# Patient Record
Sex: Female | Born: 2000 | Race: Black or African American | Hispanic: No | Marital: Single | Smoking: Former smoker
Health system: Southern US, Community
[De-identification: ages and names within clinical notes are randomized; demographics above are authoritative.]

## PROBLEM LIST (undated history)

## (undated) ENCOUNTER — Inpatient Hospital Stay (HOSPITAL_COMMUNITY): Payer: Self-pay

## (undated) DIAGNOSIS — D66 Hereditary factor VIII deficiency: Secondary | ICD-10-CM

## (undated) DIAGNOSIS — D691 Qualitative platelet defects: Secondary | ICD-10-CM

## (undated) HISTORY — PX: TONSILLECTOMY AND ADENOIDECTOMY: SUR1326

---
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
## 2019-01-02 ENCOUNTER — Ambulatory Visit: Payer: Medicaid Other | Admitting: Obstetrics and Gynecology

## 2019-01-10 ENCOUNTER — Ambulatory Visit: Payer: Medicaid Other | Admitting: Obstetrics & Gynecology

## 2019-01-15 ENCOUNTER — Ambulatory Visit: Payer: Medicaid Other

## 2019-01-16 ENCOUNTER — Other Ambulatory Visit: Payer: Self-pay

## 2019-01-16 ENCOUNTER — Encounter: Payer: Self-pay | Admitting: Obstetrics and Gynecology

## 2019-01-16 ENCOUNTER — Ambulatory Visit (INDEPENDENT_AMBULATORY_CARE_PROVIDER_SITE_OTHER): Payer: Medicaid Other | Admitting: Obstetrics and Gynecology

## 2019-01-16 DIAGNOSIS — Z1389 Encounter for screening for other disorder: Secondary | ICD-10-CM

## 2019-01-16 NOTE — Progress Notes (Signed)
   Post Partum Exam  Tammy Lynch is a 18 y.o. G37P1001 female who presents for a postpartum visit. She is 8 weeks postpartum following a spontaneous vaginal delivery. I have fully reviewed the prenatal and intrapartum course. The delivery was at 40 gestational weeks.  Anesthesia: epidural. Postpartum course has been uncomplicated. Baby's course has been uncomplicated. Baby is feeding by both breast and bottle - Good Start Gentle. Bleeding moderate lochia. Bowel function is normal. Bladder function is normal. Patient is not sexually active. Contraception method is Depo-Provera injections. Postpartum depression screening: score 5  The following portions of the patient's history were reviewed and updated as appropriate: allergies, current medications, past family history, past medical history, past social history, past surgical history and problem list. Pap smear N/A due to age and was n/a  Review of Systems Constitutional: negative Eyes: negative Ears, nose, mouth, throat, and face: negative Respiratory: negative Cardiovascular: negative Gastrointestinal: negative Genitourinary:positive for vaginal pain from repair Integument/breast: negative Hematologic/lymphatic: negative Musculoskeletal:negative Neurological: negative Behavioral/Psych: negative Endocrine: negative Allergic/Immunologic: negative    Objective:  Blood pressure 123/73, pulse 94, temperature 98.4 F (36.9 C), temperature source Oral, height 5\' 7"  (1.702 m), weight 234 lb 3.2 oz (106.2 kg), currently breastfeeding.  General:  alert, cooperative and no distress   Breasts:  inspection negative, no nipple discharge or bleeding, no masses or nodularity palpable  Lungs: clear to auscultation bilaterally  Heart:  regular rate and rhythm, S1, S2 normal, no murmur, click, rub or gallop  Abdomen: soft, non-tender; bowel sounds normal; no masses,  no organomegaly   Vulva:  normal  Vagina: normal vagina and 2nd degree repair  healing well, intact, good approximation  Cervix:  not examined  Corpus: not examined  Adnexa:  not evaluated  Rectal Exam: Not performed.        Assessment:  Encounter for postpartum visit Normal postpartum exam. Pap smear not done at today's visit.   Pain related to vaginal delivery     Plan:   1. Contraception: Depo-Provera injections 2. Reassurance given that vaginal repair well-healed with good approximation and firm support 3. Advised to soak in tub of warm water with Epsom salt 2 times/day for 15-20 mins at a time. 3. Follow up in: 1 year  or as needed. Depo every 3 months.  Laury Deep, CNM

## 2019-02-17 ENCOUNTER — Ambulatory Visit: Payer: Medicaid Other

## 2019-02-20 ENCOUNTER — Other Ambulatory Visit: Payer: Self-pay

## 2019-02-20 ENCOUNTER — Ambulatory Visit (INDEPENDENT_AMBULATORY_CARE_PROVIDER_SITE_OTHER): Payer: Medicaid Other | Admitting: *Deleted

## 2019-02-20 VITALS — BP 114/74 | HR 91 | Temp 98.3°F | Ht 67.0 in | Wt 231.0 lb

## 2019-02-20 DIAGNOSIS — Z3042 Encounter for surveillance of injectable contraceptive: Secondary | ICD-10-CM

## 2019-02-20 MED ORDER — MEDROXYPROGESTERONE ACETATE 150 MG/ML IM SUSP
150.0000 mg | INTRAMUSCULAR | Status: AC
  Administered 2019-02-20 – 2020-04-15 (×6): 150 mg via INTRAMUSCULAR

## 2019-02-20 NOTE — Progress Notes (Signed)
   Subjective:  Pt in for Depo Provera injection.    Objective: Need for contraception. No unusual complaints.    Assessment: Pt tolerated Depo injection. Depo given Left Deltoid.   Plan:  Next injection due Feb. 25-May 22, 2019.    Derl Barrow, RN

## 2019-02-24 ENCOUNTER — Inpatient Hospital Stay: Payer: Medicaid Other

## 2019-02-24 ENCOUNTER — Inpatient Hospital Stay: Payer: Medicaid Other | Admitting: Hematology

## 2019-03-26 ENCOUNTER — Other Ambulatory Visit: Payer: Medicaid Other

## 2019-03-26 ENCOUNTER — Ambulatory Visit: Payer: Medicaid Other | Admitting: Hematology

## 2019-03-27 ENCOUNTER — Telehealth: Payer: Self-pay | Admitting: Hematology

## 2019-03-27 NOTE — Telephone Encounter (Signed)
Scheduled per 1/13 sch msg. Called and spoke with pt, confirmed 2/8 appt

## 2019-03-28 ENCOUNTER — Other Ambulatory Visit: Payer: Self-pay

## 2019-03-28 ENCOUNTER — Ambulatory Visit (INDEPENDENT_AMBULATORY_CARE_PROVIDER_SITE_OTHER): Payer: Medicaid Other | Admitting: Advanced Practice Midwife

## 2019-03-28 ENCOUNTER — Other Ambulatory Visit (HOSPITAL_COMMUNITY)
Admission: RE | Admit: 2019-03-28 | Discharge: 2019-03-28 | Disposition: A | Discharge status: Home / Self Care | Payer: Medicaid Other | Source: Ambulatory Visit | Attending: Advanced Practice Midwife | Admitting: Advanced Practice Midwife

## 2019-03-28 ENCOUNTER — Encounter: Payer: Self-pay | Admitting: Advanced Practice Midwife

## 2019-03-28 VITALS — BP 124/74 | HR 76 | Wt 226.0 lb

## 2019-03-28 DIAGNOSIS — R300 Dysuria: Secondary | ICD-10-CM

## 2019-03-28 DIAGNOSIS — N9089 Other specified noninflammatory disorders of vulva and perineum: Secondary | ICD-10-CM | POA: Diagnosis not present

## 2019-03-28 LAB — POCT URINE QUALITATIVE DIPSTICK BLOOD

## 2019-03-28 MED ORDER — VALACYCLOVIR HCL 1 G PO TABS: 1000.0000 mg | ORAL_TABLET | Freq: Two times a day (BID) | ORAL | 0 refills | Status: AC

## 2019-03-28 NOTE — Progress Notes (Signed)
  Subjective:     Patient ID: Tammy Lynch, female   DOB: 11/01/2000, 19 y.o.   MRN: [id number]  Tammy Lynch is a 19 y.o. G1P1001 who is here today with pain with urination. She reports that x 1 week she has had a stinging pain with urination.   Dysuria  This is a new problem. The current episode started in the past 7 days. The problem occurs every urination. The problem has been gradually improving. The quality of the pain is described as burning. The pain is at a severity of 6/10. There has been no fever. She is sexually active. Associated symptoms include frequency. Pertinent negatives include no chills, nausea or vomiting. She has tried nothing for the symptoms.     Review of Systems  Constitutional: Negative for chills and fever.  Gastrointestinal: Negative for nausea and vomiting.  Genitourinary: Positive for dysuria and frequency. Negative for pelvic pain, vaginal bleeding and vaginal discharge.       Objective:   Physical Exam Vitals and nursing note reviewed. Exam conducted with a chaperone present (herpatic appearing lesions ).  Constitutional:      Appearance: Normal appearance.  HENT:     Head: Normocephalic.  Cardiovascular:     Rate and Rhythm: Normal rate.  Pulmonary:     Effort: Pulmonary effort is normal.  Genitourinary:    Exam position: Lithotomy position.    Skin:    General: Skin is warm and dry.  Neurological:     General: No focal deficit present.     Mental Status: She is alert and oriented to person, place, and time.        Assessment:     1. Vulvar lesion   2. Dysuria        Plan:   HSV PCR GC/CT UA  RX: valtrex 1g BID x 10 days sent to pharmacy.    Thressa Sheller DNP, CNM  03/28/19  11:40 AM

## 2019-03-31 LAB — CERVICOVAGINAL ANCILLARY ONLY
Bacterial Vaginitis (gardnerella): POSITIVE — AB
Candida Glabrata: NEGATIVE
Candida Vaginitis: NEGATIVE
Chlamydia: NEGATIVE
Comment: NEGATIVE
Comment: NEGATIVE
Comment: NEGATIVE
Comment: NEGATIVE
Comment: NEGATIVE
Comment: NORMAL
Neisseria Gonorrhea: POSITIVE — AB
Trichomonas: NEGATIVE

## 2019-03-31 LAB — HERPES SIMPLEX VIRUS CULTURE

## 2019-04-01 ENCOUNTER — Encounter: Payer: Self-pay | Admitting: Advanced Practice Midwife

## 2019-04-03 ENCOUNTER — Ambulatory Visit (INDEPENDENT_AMBULATORY_CARE_PROVIDER_SITE_OTHER): Payer: Medicaid Other | Admitting: *Deleted

## 2019-04-03 ENCOUNTER — Other Ambulatory Visit: Payer: Self-pay

## 2019-04-03 VITALS — BP 133/84 | HR 106 | Temp 98.6°F | Ht 67.0 in | Wt 233.0 lb

## 2019-04-03 DIAGNOSIS — A549 Gonococcal infection, unspecified: Secondary | ICD-10-CM

## 2019-04-03 MED ORDER — CEFTRIAXONE SODIUM 250 MG IJ SOLR
250.0000 mg | Freq: Once | INTRAMUSCULAR | Status: AC
  Administered 2019-04-03: 250 mg via INTRAMUSCULAR

## 2019-04-03 MED ORDER — AZITHROMYCIN 500 MG PO TABS
1000.0000 mg | ORAL_TABLET | Freq: Once | ORAL | Status: AC
  Administered 2019-04-03: 1000 mg via ORAL

## 2019-04-03 NOTE — Progress Notes (Signed)
   S: Patient in nurse clinic today for STD treatment of Gonorrhea.    O: Need for treatment of Gonorrhea.  A: Azithromycin 1 GM PO x 1 given and Ceftriaxone 250 mg IM x 1 given in RUOQ per [facility]'s standing orders and Thressa Sheller, CNM.  Patient observed 15 minutes in office.  No reaction noted.   P: Patient to follow up in 1 month for re-screening.    STD report form fax completed and faxed to [facility] Department at [phone] (STD department).     Patient advised to abstain from sex for 7-10 days after treatment or when partner has been tested/treated.    Clovis Pu, RN

## 2019-04-04 ENCOUNTER — Encounter: Payer: Self-pay | Admitting: General Practice

## 2019-04-11 ENCOUNTER — Telehealth: Payer: Self-pay | Admitting: General Practice

## 2019-04-11 NOTE — Telephone Encounter (Signed)
Pt aware of appt change for TOC and verbalized understanding.

## 2019-04-21 ENCOUNTER — Ambulatory Visit: Payer: Medicaid Other | Admitting: Hematology

## 2019-04-21 ENCOUNTER — Other Ambulatory Visit: Payer: Medicaid Other

## 2019-04-29 ENCOUNTER — Other Ambulatory Visit: Payer: Self-pay

## 2019-04-29 ENCOUNTER — Other Ambulatory Visit (HOSPITAL_COMMUNITY)
Admission: RE | Admit: 2019-04-29 | Discharge: 2019-04-29 | Disposition: A | Discharge status: Home / Self Care | Payer: Medicaid Other | Source: Ambulatory Visit | Attending: Obstetrics and Gynecology | Admitting: Obstetrics and Gynecology

## 2019-04-29 ENCOUNTER — Ambulatory Visit (INDEPENDENT_AMBULATORY_CARE_PROVIDER_SITE_OTHER): Payer: Medicaid Other | Admitting: *Deleted

## 2019-04-29 VITALS — BP 113/68 | HR 109 | Temp 98.0°F | Ht 67.0 in | Wt 240.0 lb

## 2019-04-29 DIAGNOSIS — Z113 Encounter for screening for infections with a predominantly sexual mode of transmission: Secondary | ICD-10-CM | POA: Diagnosis present

## 2019-04-29 NOTE — Progress Notes (Signed)
   SUBJECTIVE:  19 y.o. female in clinic for std re-screening. Patient positive for Gonorrhea 03/28/2019. Denies abnormal vaginal bleeding or significant pelvic pain or fever. No UTI symptoms. Denies history of known exposure to STD.  No LMP recorded. Patient has had an injection.  OBJECTIVE:  She appears well, afebrile. Urine dipstick: not done.  ASSESSMENT:  Denies any symptoms at this time.   PLAN:  GC, chlamydia, trichomonas, BVAG, CVAG probe sent to lab. Treatment: To be determined once lab results are received  Clovis Pu, RN

## 2019-04-30 ENCOUNTER — Ambulatory Visit: Payer: Medicaid Other

## 2019-05-01 ENCOUNTER — Other Ambulatory Visit (INDEPENDENT_AMBULATORY_CARE_PROVIDER_SITE_OTHER): Payer: Medicaid Other | Admitting: Obstetrics and Gynecology

## 2019-05-01 DIAGNOSIS — N760 Acute vaginitis: Secondary | ICD-10-CM

## 2019-05-01 DIAGNOSIS — B9689 Other specified bacterial agents as the cause of diseases classified elsewhere: Secondary | ICD-10-CM

## 2019-05-01 LAB — CERVICOVAGINAL ANCILLARY ONLY
Bacterial Vaginitis (gardnerella): POSITIVE — AB
Candida Glabrata: NEGATIVE
Candida Vaginitis: NEGATIVE
Chlamydia: NEGATIVE
Comment: NEGATIVE
Comment: NEGATIVE
Comment: NEGATIVE
Comment: NEGATIVE
Comment: NEGATIVE
Comment: NORMAL
Neisseria Gonorrhea: NEGATIVE
Trichomonas: NEGATIVE

## 2019-05-01 MED ORDER — METRONIDAZOLE 500 MG PO TABS: 500.0000 mg | ORAL_TABLET | Freq: Two times a day (BID) | ORAL | 0 refills | Status: DC

## 2019-05-01 NOTE — Progress Notes (Signed)
Patient notified of (+) BV and tx via My Chart message.

## 2019-05-12 ENCOUNTER — Ambulatory Visit: Payer: Medicaid Other

## 2019-05-19 ENCOUNTER — Ambulatory Visit: Payer: Medicaid Other

## 2019-05-21 ENCOUNTER — Encounter: Payer: Self-pay | Admitting: General Practice

## 2019-05-21 ENCOUNTER — Other Ambulatory Visit: Payer: Self-pay

## 2019-05-21 ENCOUNTER — Ambulatory Visit (INDEPENDENT_AMBULATORY_CARE_PROVIDER_SITE_OTHER): Payer: Medicaid Other | Admitting: *Deleted

## 2019-05-21 DIAGNOSIS — Z3042 Encounter for surveillance of injectable contraceptive: Secondary | ICD-10-CM

## 2019-05-21 NOTE — Progress Notes (Signed)
   Subjective:  Pt in for Depo Provera injection.    Objective: Need for contraception. No unusual complaints.    Assessment: Pt tolerated Depo injection. Depo given Left Deltoid.   Plan:  Next injection due May 26-August 20, 2019.    Khalik Pewitt L, RN   

## 2019-08-06 ENCOUNTER — Other Ambulatory Visit: Payer: Self-pay

## 2019-08-06 ENCOUNTER — Encounter: Payer: Self-pay | Admitting: Obstetrics and Gynecology

## 2019-08-06 ENCOUNTER — Other Ambulatory Visit (HOSPITAL_COMMUNITY)
Admission: RE | Admit: 2019-08-06 | Discharge: 2019-08-06 | Disposition: A | Discharge status: Home / Self Care | Payer: Medicaid Other | Source: Ambulatory Visit | Attending: Obstetrics and Gynecology | Admitting: Obstetrics and Gynecology

## 2019-08-06 ENCOUNTER — Ambulatory Visit (INDEPENDENT_AMBULATORY_CARE_PROVIDER_SITE_OTHER): Payer: Medicaid Other | Admitting: Obstetrics and Gynecology

## 2019-08-06 VITALS — BP 110/62 | HR 90 | Temp 98.8°F | Ht 67.0 in | Wt 239.8 lb

## 2019-08-06 DIAGNOSIS — N898 Other specified noninflammatory disorders of vagina: Secondary | ICD-10-CM

## 2019-08-06 DIAGNOSIS — R102 Pelvic and perineal pain: Secondary | ICD-10-CM

## 2019-08-06 DIAGNOSIS — Z30013 Encounter for initial prescription of injectable contraceptive: Secondary | ICD-10-CM

## 2019-08-06 MED ORDER — IBUPROFEN 600 MG PO TABS: 600.0000 mg | ORAL_TABLET | Freq: Four times a day (QID) | ORAL | 1 refills | Status: DC | PRN

## 2019-08-06 NOTE — Progress Notes (Signed)
   Subjective:  Pt in for Depo Provera injection.    Objective: Need for contraception. No unusual complaints.    Assessment: Pt tolerated Depo injection. Depo given Right.   Plan:  Next injection due August 11-25, 2021.    Clovis Pu, RN

## 2019-08-06 NOTE — Progress Notes (Signed)
    GYNECOLOGY PROGRESS NOTE  History:  Ms. Tammy Lynch is a 19 y.o. G1P1001 presents to [facility] office today for problem gyn visit. She reports lower abdominal and pelvic pain, vaginal discharge with odor. She reports she was using a scented soap, but has switched to an unscented soap since noticing the discharge and odor. She last had unprotected sex 2 days ago.  She denies h/a, dizziness, shortness of breath, n/v, or fever/chills.    The following portions of the patient's history were reviewed and updated as appropriate: allergies, current medications, past family history, past medical history, past social history, past surgical history and problem list.   Review of Systems:  Pertinent items are noted in HPI.   Objective:  Physical Exam Blood pressure 110/62, pulse 90, temperature 98.8 F (37.1 C), temperature source Oral, height 5\' 7"  (1.702 m), weight 239 lb 12.8 oz (108.8 kg), not currently breastfeeding. VS reviewed, nursing note reviewed,  Constitutional: well developed, well nourished, no distress HEENT: normocephalic CV: normal rate Pulm/chest wall: normal effort Breast Exam: deferred Abdomen: soft Neuro: alert and oriented x 3 Skin: warm, dry Psych: affect normal Pelvic exam: Cervix pink, visually closed, without lesion, scant clear, mucoid discharge blood streaks, vaginal walls and external genitalia normal Bimanual exam: Cervix 0/long/high, firm, anterior, neg CMT, uterus nontender, nonenlarged, adnexa without tenderness, enlargement, or mass  Assessment & Plan:  1. Vaginal discharge - Cervicovaginal ancillary only( [place])  2. Vaginal odor - Cervicovaginal ancillary only( [place]) - Advised will treat with (+) results  3. Pelvic pain in female - Cervicovaginal ancillary only( ) - Rx for ibuprofen (ADVIL) 600 MG tablet; Take 1 tablet (600 mg total) by mouth every 6 (six) hours as needed.  Dispense: 30 tablet; Refill: 1 - Advised to  take Ibuprofen every 6 hours for the next 48 hours - Call back when having the vaginal discharge   There was 10 minutes spent discussing complaints, differential dx's, plan for follow-up and prescriptions. There was 5 minutes of chart review time spent prior to this encounter. Total time spent = 15 minutes.   , CNM 11:30 AM

## 2019-08-06 NOTE — Patient Instructions (Signed)
Pelvic Pain, Female Pelvic pain is pain in your lower abdomen, below your belly button and between your hips. The pain may start suddenly (be acute), keep coming back (be recurring), or last a long time (become chronic). Pelvic pain that lasts longer than 6 months is considered chronic. Pelvic pain may affect your:  Reproductive organs.  Urinary system.  Digestive tract.  Musculoskeletal system. There are many potential causes of pelvic pain. Sometimes, the pain can be a result of digestive or urinary conditions, strained muscles or ligaments, or reproductive conditions. Sometimes the cause of pelvic pain is not known. Follow these instructions at home:   Take over-the-counter and prescription medicines only as told by your health care provider.  Rest as told by your health care provider.  Do not have sex if it hurts.  Keep a journal of your pelvic pain. Write down: ? When the pain started. ? Where the pain is located. ? What seems to make the pain better or worse, such as food or your period (menstrual cycle). ? Any symptoms you have along with the pain.  Keep all follow-up visits as told by your health care provider. This is important. Contact a health care provider if:  Medicine does not help your pain.  Your pain comes back.  You have new symptoms.  You have abnormal vaginal discharge or bleeding, including bleeding after menopause.  You have a fever or chills.  You are constipated.  You have blood in your urine or stool.  You have foul-smelling urine.  You feel weak or light-headed. Get help right away if:  You have sudden severe pain.  Your pain gets steadily worse.  You have severe pain along with fever, nausea, vomiting, or excessive sweating.  You lose consciousness. Summary  Pelvic pain is pain in your lower abdomen, below your belly button and between your hips.  There are many potential causes of pelvic pain.  Keep a journal of your pelvic  pain. This information is not intended to replace advice given to you by your health care provider. Make sure you discuss any questions you have with your health care provider. Document Revised: 08/15/2017 Document Reviewed: 08/15/2017 Elsevier Patient Education  2020 [employer]  

## 2019-08-07 ENCOUNTER — Telehealth: Payer: Self-pay | Admitting: *Deleted

## 2019-08-07 DIAGNOSIS — B9689 Other specified bacterial agents as the cause of diseases classified elsewhere: Secondary | ICD-10-CM

## 2019-08-07 LAB — CERVICOVAGINAL ANCILLARY ONLY
Bacterial Vaginitis (gardnerella): POSITIVE — AB
Candida Glabrata: NEGATIVE
Candida Vaginitis: NEGATIVE
Chlamydia: NEGATIVE
Comment: NEGATIVE
Comment: NEGATIVE
Comment: NEGATIVE
Comment: NEGATIVE
Comment: NEGATIVE
Comment: NORMAL
Neisseria Gonorrhea: NEGATIVE
Trichomonas: NEGATIVE

## 2019-08-07 MED ORDER — METRONIDAZOLE 500 MG PO TABS: 500.0000 mg | ORAL_TABLET | Freq: Two times a day (BID) | ORAL | 0 refills | Status: DC

## 2019-08-07 NOTE — Telephone Encounter (Signed)
-----   Message from Raelyn Mora, [place] sent at 08/07/2019  3:40 PM EDT ----- Please treat for BV

## 2019-08-12 ENCOUNTER — Ambulatory Visit: Payer: Medicaid Other

## 2019-10-27 ENCOUNTER — Ambulatory Visit: Payer: Medicaid Other

## 2019-10-29 ENCOUNTER — Ambulatory Visit (HOSPITAL_COMMUNITY)
Admission: EM | Admit: 2019-10-29 | Discharge: 2019-10-29 | Disposition: A | Discharge status: Home / Self Care | Payer: Medicaid Other | Attending: Family Medicine | Admitting: Family Medicine

## 2019-10-29 ENCOUNTER — Ambulatory Visit (INDEPENDENT_AMBULATORY_CARE_PROVIDER_SITE_OTHER): Payer: Medicaid Other

## 2019-10-29 ENCOUNTER — Other Ambulatory Visit: Payer: Self-pay

## 2019-10-29 ENCOUNTER — Encounter (HOSPITAL_COMMUNITY): Payer: Self-pay

## 2019-10-29 DIAGNOSIS — R102 Pelvic and perineal pain: Secondary | ICD-10-CM

## 2019-10-29 DIAGNOSIS — S80912A Unspecified superficial injury of left knee, initial encounter: Secondary | ICD-10-CM

## 2019-10-29 DIAGNOSIS — S8002XA Contusion of left knee, initial encounter: Secondary | ICD-10-CM | POA: Diagnosis not present

## 2019-10-29 DIAGNOSIS — S8992XA Unspecified injury of left lower leg, initial encounter: Secondary | ICD-10-CM | POA: Diagnosis not present

## 2019-10-29 MED ORDER — IBUPROFEN 600 MG PO TABS: 600.0000 mg | ORAL_TABLET | Freq: Three times a day (TID) | ORAL | 1 refills | Status: DC | PRN

## 2019-10-29 NOTE — ED Triage Notes (Signed)
Pt states she was getting down of of her bed last night and hit her left knee off of window sill. Pt c/o 7/10 sharp throbbing pain in left knee. Pt states knee is tender to touch. Pt has 2+ swelling in the middle of left knee. PT limped to triage room.

## 2019-10-29 NOTE — Discharge Instructions (Signed)
Your x ray was normal Ibuprofen for pain as needed Rest, ice, elevate Follow up as needed for continued or worsening symptoms

## 2019-10-31 NOTE — ED Provider Notes (Signed)
[facility]    CSN: [id number] Arrival date & time: 10/29/19  1435      History   Chief Complaint Chief Complaint  Patient presents with   Knee Injury    HPI Tammy Lynch is a 19 y.o. female.   Patient is a 19 year old female presents today with left knee pain.  Symptoms been constant.  Started after hitting her left knee on the windowsill.  This occurred last night.  Complaining of 7 out of 10 sharp throbbing pain to left knee.  Tender to touch with mild swelling.  Has not take anything for the pain.     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  Seen by provider and denied diagnosis     Patient Active Problem List   Diagnosis Date Noted   Normal labor 11/27/2018   Supervision of high risk pregnancy, antepartum 07/16/2018   Anemia affecting pregnancy, antepartum 07/16/2018    Past Surgical History:  Procedure Laterality Date   TONSILLECTOMY AND ADENOIDECTOMY      OB History    Gravida  1   Para  1   Term  1   Preterm  0   AB  0   Living  1     SAB  0   TAB  0   Ectopic  0   Multiple  0   Live Births  1            Home Medications    Prior to Admission medications   Medication Sig Start Date End Date Taking? Authorizing Provider  ibuprofen (ADVIL) 600 MG tablet Take 1 tablet (600 mg total) by mouth every 8 (eight) hours as needed. 10/29/19   Dahlia Byes A, NP  metroNIDAZOLE (FLAGYL) 500 MG tablet Take 1 tablet (500 mg total) by mouth 2 (two) times daily. 08/07/19   Raelyn Mora, CNM    Family History Family History  Problem Relation Age of Onset   Diabetes Mother    Diabetes Maternal Grandmother    Hearing loss Maternal Grandfather     Social History Social History   Tobacco Use   Smoking status: Former Smoker    Types: Cigars   Smokeless tobacco: Never Used  Building services engineer Use: Never used  Substance Use Topics   Alcohol use: Not  Currently   Drug use: Not Currently    Types: Marijuana    Comment: last used 05-11-18     Allergies   Patient has no known allergies.   Review of Systems Review of Systems   Physical Exam Triage Vital Signs ED Triage Vitals  Enc Vitals Group     BP 10/29/19 1524 (!) 123/49     Pulse Rate 10/29/19 1524 71     Resp 10/29/19 1524 16     Temp 10/29/19 1524 98.7 F (37.1 C)     Temp Source 10/29/19 1524 Oral     SpO2 10/29/19 1524 100 %     Weight 10/29/19 1527 235 lb (106.6 kg)     Height 10/29/19 1527 5\' 7"  (1.702 m)     Head Circumference --      Peak Flow --      Pain Score 10/29/19 1527 7     Pain Loc --      Pain Edu? --      Excl. in GC? --    No data found.  Updated Vital Signs  BP (!) 123/49    Pulse 71    Temp 98.7 F (37.1 C) (Oral)    Resp 16    Ht 5\' 7"  (1.702 m)    Wt 235 lb (106.6 kg)    LMP 10/27/2019 (Exact Date)    SpO2 100%    BMI 36.81 kg/m   Visual Acuity Right Eye Distance:   Left Eye Distance:   Bilateral Distance:    Right Eye Near:   Left Eye Near:    Bilateral Near:     Physical Exam Vitals and nursing note reviewed.  Constitutional:      General: She is not in acute distress.    Appearance: Normal appearance. She is not ill-appearing, toxic-appearing or diaphoretic.  HENT:     Head: Normocephalic.     Nose: Nose normal.  Eyes:     Conjunctiva/sclera: Conjunctivae normal.  Pulmonary:     Effort: Pulmonary effort is normal.  Musculoskeletal:     Cervical back: Normal range of motion.     Left knee: Swelling present. Decreased range of motion. Tenderness present.  Skin:    General: Skin is warm and dry.     Findings: No rash.  Neurological:     Mental Status: She is alert.  Psychiatric:        Mood and Affect: Mood normal.      UC Treatments / Results  Labs (all labs ordered are listed, but only abnormal results are displayed) Labs Reviewed - No data to display  EKG   Radiology DG Knee Complete 4 Views  Left  Result Date: 10/29/2019 CLINICAL DATA:  19 year old female with trauma to the left knee. EXAM: LEFT KNEE - COMPLETE 4+ VIEW COMPARISON:  None. FINDINGS: No evidence of fracture, dislocation, or joint effusion. No evidence of arthropathy or other focal bone abnormality. Soft tissues are unremarkable. IMPRESSION: Negative. Electronically Signed   By: 12 M.D.   On: 10/29/2019 16:25    Procedures Procedures (including critical care time)  Medications Ordered in UC Medications - No data to display  Initial Impression / Assessment and Plan / UC Course  I have reviewed the triage vital signs and the nursing notes.  Pertinent labs & imaging results that were available during my care of the patient were reviewed by me and considered in my medical decision making (see chart for details).  Clinical Course as of Oct 31 1039  Wed Oct 29, 2019  1629 DG Knee Complete 4 Views Left [TB]    Clinical Course User Index [TB] 1630 A, NP    Knee pain X-ray without any acute findings.  Most likely bad bruise.  I Ibuprofen for pain as needed.  Rest, ice, elevate Follow up as needed for continued or worsening symptoms  Final Clinical Impressions(s) / UC Diagnoses   Final diagnoses:  Contusion of left knee, initial encounter     Discharge Instructions     Your x ray was normal Ibuprofen for pain as needed Rest, ice, elevate Follow up as needed for continued or worsening symptoms     ED Prescriptions    Medication Sig Dispense Auth. Provider   ibuprofen (ADVIL) 600 MG tablet Take 1 tablet (600 mg total) by mouth every 8 (eight) hours as needed. 30 tablet Dahlia Byes A, NP     PDMP not reviewed this encounter.   Dahlia Byes, NP 10/31/19 1041

## 2019-11-03 ENCOUNTER — Ambulatory Visit (INDEPENDENT_AMBULATORY_CARE_PROVIDER_SITE_OTHER): Payer: Medicaid Other | Admitting: *Deleted

## 2019-11-03 ENCOUNTER — Other Ambulatory Visit: Payer: Self-pay

## 2019-11-03 DIAGNOSIS — Z3042 Encounter for surveillance of injectable contraceptive: Secondary | ICD-10-CM | POA: Diagnosis not present

## 2019-11-03 NOTE — Progress Notes (Signed)
   Subjective:  Pt in for Depo Provera injection.    Objective: Need for contraception. No unusual complaints.    Assessment: Pt tolerated Depo injection. Depo given Left Deltoid.   Plan:  Next injection due November 8-22, 2021.    Clovis Pu, RN

## 2019-12-12 ENCOUNTER — Encounter (INDEPENDENT_AMBULATORY_CARE_PROVIDER_SITE_OTHER): Payer: Self-pay | Admitting: Primary Care

## 2020-01-19 ENCOUNTER — Ambulatory Visit: Payer: Medicaid Other

## 2020-01-27 ENCOUNTER — Other Ambulatory Visit: Payer: Self-pay

## 2020-01-27 ENCOUNTER — Ambulatory Visit (INDEPENDENT_AMBULATORY_CARE_PROVIDER_SITE_OTHER): Payer: Medicaid Other | Admitting: *Deleted

## 2020-01-27 DIAGNOSIS — Z3042 Encounter for surveillance of injectable contraceptive: Secondary | ICD-10-CM

## 2020-01-27 NOTE — Progress Notes (Signed)
   Subjective:  Pt in for Depo Provera injection.    Objective: Need for contraception. No unusual complaints.    Assessment: Pt tolerated Depo injection. Depo given Left Deltoid.   Plan:  Next injection due Feb. 1-15, 2022 and annual exam.    Clovis Pu, RN

## 2020-03-04 ENCOUNTER — Emergency Department (HOSPITAL_COMMUNITY)
Admission: EM | Admit: 2020-03-04 | Discharge: 2020-03-05 | Disposition: A | Discharge status: Home / Self Care | Payer: Medicaid Other | Attending: Emergency Medicine | Admitting: Emergency Medicine

## 2020-03-04 DIAGNOSIS — N898 Other specified noninflammatory disorders of vagina: Secondary | ICD-10-CM | POA: Diagnosis not present

## 2020-03-04 DIAGNOSIS — Z87891 Personal history of nicotine dependence: Secondary | ICD-10-CM | POA: Insufficient documentation

## 2020-03-04 DIAGNOSIS — R1030 Lower abdominal pain, unspecified: Secondary | ICD-10-CM | POA: Diagnosis not present

## 2020-03-04 DIAGNOSIS — M549 Dorsalgia, unspecified: Secondary | ICD-10-CM | POA: Diagnosis not present

## 2020-03-04 DIAGNOSIS — R52 Pain, unspecified: Secondary | ICD-10-CM | POA: Diagnosis not present

## 2020-03-04 NOTE — ED Triage Notes (Addendum)
Pt presents to ED BIB [facility]. Pt c/o bilateral flank pain radiating towards abd. als c/o urinary frequency. Pt reports that she had unprotected sex 1w ago. EMS VSS.

## 2020-03-05 ENCOUNTER — Other Ambulatory Visit: Payer: Self-pay

## 2020-03-05 LAB — WET PREP, GENITAL
Sperm: NONE SEEN
Trich, Wet Prep: NONE SEEN
Yeast Wet Prep HPF POC: NONE SEEN

## 2020-03-05 LAB — URINALYSIS, ROUTINE W REFLEX MICROSCOPIC
Bilirubin Urine: NEGATIVE
Glucose, UA: NEGATIVE mg/dL
Hgb urine dipstick: NEGATIVE
Ketones, ur: NEGATIVE mg/dL
Leukocytes,Ua: NEGATIVE
Nitrite: NEGATIVE
Protein, ur: NEGATIVE mg/dL
Specific Gravity, Urine: 1.017 (ref 1.005–1.030)
pH: 7 (ref 5.0–8.0)

## 2020-03-05 LAB — I-STAT BETA HCG BLOOD, ED (MC, WL, AP ONLY): I-stat hCG, quantitative: <5 m[IU]/mL (ref <5)

## 2020-03-05 MED ORDER — AZITHROMYCIN 250 MG PO TABS
1000.0000 mg | ORAL_TABLET | Freq: Once | ORAL | Status: AC
  Administered 2020-03-05: 06:00:00 1000 mg via ORAL
  Filled 2020-03-05: qty 4

## 2020-03-05 MED ORDER — METRONIDAZOLE 500 MG PO TABS: 500.0000 mg | ORAL_TABLET | Freq: Two times a day (BID) | ORAL | 0 refills | Status: DC

## 2020-03-05 MED ORDER — DOXYCYCLINE HYCLATE 100 MG PO CAPS: 100.0000 mg | ORAL_CAPSULE | Freq: Two times a day (BID) | ORAL | 0 refills | Status: DC

## 2020-03-05 MED ORDER — CEFTRIAXONE SODIUM 500 MG IJ SOLR
500.0000 mg | Freq: Once | INTRAMUSCULAR | Status: AC
  Administered 2020-03-05: 06:00:00 500 mg via INTRAMUSCULAR
  Filled 2020-03-05: qty 500

## 2020-03-05 NOTE — ED Provider Notes (Addendum)
[facility] EMERGENCY DEPARTMENT Provider Note   CSN: [id number] Arrival date & time: 03/04/20  2357     History Chief Complaint  Patient presents with  . Flank Pain    Shalice Canion is a 19 y.o. female.  The history is provided by the patient and medical records.  Flank Pain    18 y.o. F presenting to the ED with some side and lower abdominal pain.  States this just started today.  She has been having urinary frequency but denies dysuria or hematuria.  No fever/chills, nausea, vomiting, diarrhea.  She is eating/drinking well.  States she did have unprotected sex last week and new symptoms developed since then.  Is not sure if partner currently with any symptoms.  Does have remote hx of STD in the past.  Past Medical History:  Diagnosis Date  . 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  Seen by provider and denied diagnosis     Patient Active Problem List   Diagnosis Date Noted  . Normal labor 11/27/2018  . Supervision of high risk pregnancy, antepartum 07/16/2018  . Anemia affecting pregnancy, antepartum 07/16/2018    Past Surgical History:  Procedure Laterality Date  . TONSILLECTOMY AND ADENOIDECTOMY       OB History    Gravida  1   Para  1   Term  1   Preterm  0   AB  0   Living  1     SAB  0   IAB  0   Ectopic  0   Multiple  0   Live Births  1           Family History  Problem Relation Age of Onset  . Diabetes Mother   . Diabetes Maternal Grandmother   . Hearing loss Maternal Grandfather     Social History   Tobacco Use  . Smoking status: Former Smoker    Types: Cigars  . Smokeless tobacco: Never Used  Vaping Use  . Vaping Use: Never used  Substance Use Topics  . Alcohol use: Not Currently  . Drug use: Not Currently    Types: Marijuana    Comment: last used 05-11-18    Home Medications Prior to Admission medications   Medication Sig Start Date End Date Taking? Authorizing Provider   ibuprofen (ADVIL) 600 MG tablet Take 1 tablet (600 mg total) by mouth every 8 (eight) hours as needed. 10/29/19   Dahlia Byes A, NP  metroNIDAZOLE (FLAGYL) 500 MG tablet Take 1 tablet (500 mg total) by mouth 2 (two) times daily. 08/07/19   Raelyn Mora, CNM    Allergies    Patient has no known allergies.  Review of Systems   Review of Systems  Genitourinary: Positive for flank pain.  All other systems reviewed and are negative.   Physical Exam Updated Vital Signs BP 122/67 (BP Location: Left Arm)   Pulse 81   Temp 98.9 F (37.2 C) (Oral)   Resp 18   SpO2 99%   Physical Exam Vitals and nursing note reviewed.  Constitutional:      Appearance: She is well-developed and well-nourished.     Comments: Sleeping, awoken for exam  HENT:     Head: Normocephalic and atraumatic.     Mouth/Throat:     Mouth: Oropharynx is clear and moist.  Eyes:     Extraocular Movements: EOM normal.     Conjunctiva/sclera: Conjunctivae normal.     Pupils: Pupils  are equal, round, and reactive to light.  Cardiovascular:     Rate and Rhythm: Normal rate and regular rhythm.     Heart sounds: Normal heart sounds.  Pulmonary:     Effort: Pulmonary effort is normal. No respiratory distress.     Breath sounds: Normal breath sounds. No rhonchi.  Abdominal:     General: Bowel sounds are normal.     Palpations: Abdomen is soft.     Tenderness: There is no abdominal tenderness. There is no rebound.     Comments: Soft, non-tender  Genitourinary:    Comments: Exam chaperoned by RN, normal external genitalia, moderate amount of thick, white vaginal discharge, appears to have some irritation around the cervix, no irregular bleeding/masses, no adnexal tenderness, some CMT noted Musculoskeletal:        General: Normal range of motion.     Cervical back: Normal range of motion.  Skin:    General: Skin is warm and dry.  Neurological:     Mental Status: She is alert and oriented to person, place, and time.   Psychiatric:        Mood and Affect: Mood and affect normal.     ED Results / Procedures / Treatments   Labs (all labs ordered are listed, but only abnormal results are displayed) Labs Reviewed  WET PREP, GENITAL  URINALYSIS, ROUTINE W REFLEX MICROSCOPIC  I-STAT BETA HCG BLOOD, ED ([facility], [facility], [facility] ONLY)  GC/CHLAMYDIA PROBE AMP ([facility]) NOT AT [facility]    EKG None  Radiology No results found.  Procedures Procedures (including critical care time)  Medications Ordered in ED Medications - No data to display  ED Course  I have reviewed the triage vital signs and the nursing notes.  Pertinent labs & imaging results that were available during my care of the patient were reviewed by me and considered in my medical decision making (see chart for details).    MDM Rules/Calculators/A&P    19 y.o. F here with lower abdominal/flank/back pain after unprotected sex last week.  Unsure if partner currently with symptoms.  She is afebrile, non-toxic, NAD.  Abdomen soft, non-tender.  She reports urinary frequency but denies any dysuria or hematuria.  UA here is completely negative.  Possibility of STD discussed and pelvic exam performed-- does have discharge noted along with cervical irritation and CMT.  No adnexal tenderness.  No masses noted.    5:03 AM Notified that wet prep could not be processed, fluid seemed to have leaked out while in tube station.  Had patient perform self swab and this was sent.  Patient likely with component of cervicitis/PID so will treat empirically.  Discussed reasoning for this and patient comfortable with this plan.  Given rocephin/azithromycin here, plan to d/c home with doxycycline and flagyl for 2 weeks.  Close follow-up with PCP.  Return here for any new/acute changes.  Final Clinical Impression(s) / ED Diagnoses Final diagnoses:  Lower abdominal pain  Vaginal discharge    Rx / DC Orders ED Discharge Orders         Ordered    metroNIDAZOLE (FLAGYL) 500  MG tablet  2 times daily        03/05/20 0535    doxycycline (VIBRAMYCIN) 100 MG capsule  2 times daily        03/05/20 0535           Garlon Hatchet, PA-C 03/05/20 0547    Garlon Hatchet, PA-C 03/05/20 0548    Ward,  Layla Maw, DO 03/05/20 [phone]

## 2020-03-05 NOTE — ED Notes (Signed)
E-signature pad unavailable at time of pt discharge. This RN discussed discharge materials with pt and answered all pt questions. Pt stated understanding of discharge material. ? ?

## 2020-03-05 NOTE — Discharge Instructions (Addendum)
Take the prescribed medication as directed.  Make sure to finish all of them. Follow-up with your primary care doctor. Return to the ED for new or worsening symptoms.

## 2020-03-08 ENCOUNTER — Telehealth: Payer: Self-pay

## 2020-03-08 LAB — GC/CHLAMYDIA PROBE AMP (~~LOC~~) NOT AT ARMC
Chlamydia: NEGATIVE
Comment: NEGATIVE
Comment: NORMAL
Neisseria Gonorrhea: NEGATIVE

## 2020-03-08 NOTE — Telephone Encounter (Signed)
Transition Care Management Unsuccessful Follow-up Telephone Call  Date of discharge and from where:  03/04/2020 from [facility]  Attempts:  1st Attempt  Reason for unsuccessful TCM follow-up call:  Unable to leave message

## 2020-03-09 NOTE — Telephone Encounter (Signed)
Transition Care Management Unsuccessful Follow-up Telephone Call  Date of discharge and from where:  03/04/2020 from [facility]   Attempts:  2nd Attempt  Reason for unsuccessful TCM follow-up call:  Unable to leave message

## 2020-03-10 NOTE — Telephone Encounter (Signed)
Transition Care Management Unsuccessful Follow-up Telephone Call  Date of discharge and from where:  03/04/2020 from [facility]  Attempts:  3rd Attempt  Reason for unsuccessful TCM follow-up call:  No answer/busy

## 2020-04-15 ENCOUNTER — Ambulatory Visit (INDEPENDENT_AMBULATORY_CARE_PROVIDER_SITE_OTHER): Payer: Medicaid Other | Admitting: Obstetrics and Gynecology

## 2020-04-15 ENCOUNTER — Other Ambulatory Visit: Payer: Self-pay

## 2020-04-15 ENCOUNTER — Encounter: Payer: Self-pay | Admitting: Obstetrics and Gynecology

## 2020-04-15 VITALS — BP 113/70 | HR 106 | Temp 98.0°F | Ht 67.0 in | Wt 264.8 lb

## 2020-04-15 DIAGNOSIS — Z3042 Encounter for surveillance of injectable contraceptive: Secondary | ICD-10-CM

## 2020-04-15 DIAGNOSIS — Z01419 Encounter for gynecological examination (general) (routine) without abnormal findings: Secondary | ICD-10-CM | POA: Diagnosis not present

## 2020-04-15 NOTE — Progress Notes (Signed)
   Subjective:  Pt in for Depo Provera injection.    Objective: Need for contraception. No unusual complaints.    Assessment: Pt tolerated Depo injection. Depo given left deltoid.  Plan:  Next injection due July 01, 2020-Jul 15, 2020.    Clovis Pu, RN

## 2020-04-15 NOTE — Progress Notes (Signed)
   WELL-WOMAN PHYSICAL & PAP Patient name: Tammy Lynch MRN [id number]  Date of birth: 2000-07-05 Chief Complaint:   Gynecologic Exam  History of Present Illness:   Tammy Lynch is a 20 y.o. G69P1001 African American female being seen today for a routine well-woman exam.  Current complaints: none  PCP: Gwinda Passe, FNP      does not desire labs No LMP recorded. Patient has had an injection. The current method of family planning is Depo-Provera injections.  Last pap n/a. Results were: n/a Last mammogram: n/a. Results were: n/a. Family h/o breast cancer: No Last colonoscopy: n/a. Results were: n/a. Family h/o colorectal cancer: No Review of Systems:   Pertinent items are noted in HPI Denies any headaches, blurred vision, fatigue, shortness of breath, chest pain, abdominal pain, abnormal vaginal discharge/itching/odor/irritation, problems with periods, bowel movements, urination, or intercourse unless otherwise stated above. Pertinent History Reviewed:  Reviewed past medical,surgical, social and family history.  Reviewed problem list, medications and allergies. Physical Assessment:   Vitals:   04/15/20 1022  BP: 113/70  Pulse: (!) 106  Temp: 98 F (36.7 C)  TempSrc: Oral  Weight: 264 lb 12.8 oz (120.1 kg)  Height: 5\' 7"  (1.702 m)  Body mass index is 41.47 kg/m.        Physical Examination:   General appearance - well appearing, and in no distress  Mental status - alert, oriented to person, place, and time  Psych:  She has a normal mood and affect  Skin - warm and dry, normal color, no suspicious lesions noted  Chest - effort normal, all lung fields clear to auscultation bilaterally  Heart - normal rate and regular rhythm  Neck:  midline trachea, no thyromegaly or nodules  Breasts - breasts appear normal, no suspicious masses, no skin or nipple changes or  axillary nodes  Abdomen - soft, nontender, nondistended, no masses or organomegaly  Pelvic - VULVA: normal  appearing vulva with no masses, tenderness or lesions  VAGINA: normal appearing vagina with normal color and discharge, no lesions  CERVIX: normal appearing cervix without discharge or lesions, no CMT  Thin prep pap is not done  UTERUS: uterus is felt to be normal size, shape, consistency and nontender   ADNEXA: No adnexal masses or tenderness noted.  Rectal - none  Extremities:  No swelling or varicosities noted  No results found for this or any previous visit (from the past 24 hour(s)).  Assessment & Plan:  1) Well woman exam with routine gynecological exam - No pap d/t age - Advised to have pap at age 45; next year  2) Family planning, Depo-Provera contraception monitoring/administration - Depo given  Labs/procedures today: none  Mammogram at age 60 or sooner if problems Colonoscopy at age 56 or sooner if problems  No orders of the defined types were placed in this encounter.   Meds: No orders of the defined types were placed in this encounter.   Follow-up: Return in about 1 year (around 04/15/2021) for Annual Exam with Pap.  06/13/2021 MSN, CNM 04/15/2020 3:11 PM

## 2020-08-11 ENCOUNTER — Ambulatory Visit: Payer: Medicaid Other

## 2021-04-14 ENCOUNTER — Telehealth: Payer: Self-pay | Admitting: Certified Nurse Midwife

## 2021-04-14 ENCOUNTER — Ambulatory Visit: Payer: Medicaid Other | Admitting: Obstetrics and Gynecology

## 2021-04-14 NOTE — Telephone Encounter (Signed)
Attempted to reach patient about her appointment getting rescheduled. Was not able to leave a message, phone was a fast busy sound.

## 2021-04-15 ENCOUNTER — Ambulatory Visit: Payer: Medicaid Other | Admitting: Obstetrics & Gynecology

## 2021-05-02 ENCOUNTER — Other Ambulatory Visit: Payer: Self-pay

## 2021-05-02 ENCOUNTER — Ambulatory Visit (HOSPITAL_COMMUNITY)
Admission: EM | Admit: 2021-05-02 | Discharge: 2021-05-02 | Disposition: A | Discharge status: Home / Self Care | Payer: Medicaid Other | Attending: Sports Medicine | Admitting: Sports Medicine

## 2021-05-02 ENCOUNTER — Encounter (HOSPITAL_COMMUNITY): Payer: Self-pay | Admitting: Emergency Medicine

## 2021-05-02 DIAGNOSIS — K047 Periapical abscess without sinus: Secondary | ICD-10-CM | POA: Diagnosis not present

## 2021-05-02 DIAGNOSIS — K0889 Other specified disorders of teeth and supporting structures: Secondary | ICD-10-CM

## 2021-05-02 DIAGNOSIS — J029 Acute pharyngitis, unspecified: Secondary | ICD-10-CM

## 2021-05-02 DIAGNOSIS — K120 Recurrent oral aphthae: Secondary | ICD-10-CM | POA: Diagnosis not present

## 2021-05-02 MED ORDER — AMOXICILLIN-POT CLAVULANATE 875-125 MG PO TABS: 1.0000 | ORAL_TABLET | Freq: Two times a day (BID) | ORAL | 0 refills | Status: AC

## 2021-05-02 NOTE — ED Triage Notes (Signed)
Pt reports dental pain that has gotten progressively worse over the past month.  States has a dentist appointment scheduled in March for extraction of wisdom teeth.

## 2021-05-02 NOTE — ED Provider Notes (Signed)
[facility]    CSN: [id number] Arrival date & time: 05/02/21  1128      History   Chief Complaint Chief Complaint  Patient presents with   Dental Pain    HPI Tammy Lynch is a 21 y.o. female here for dental pain and sore throat.   Dental Pain Associated symptoms: oral lesions   Associated symptoms: no congestion, no headaches and no neck pain    Patient reports having an issue with her left upper molar/wisdom tooth.  She has been seeing a dentist and has an appointment upcoming in March for them to clean and possibly plan on removal.  She states she has noticed some redness and pain around this tooth over the last few days.  She also has noticed some white ulcers on the top aspect of her mouth the last few days that have been bothersome.  She does have some pain with swallowing.  She denies any fever or chills or self.  Otherwise denies any runny nose, stuffy nose.  He does note some tender swollen lymph nodes in the neck.  Has been somewhat tired recently.   Denies any recent sick contacts.  She did think she had a cold about a week ago but this has slightly gotten better.  She denies any chest pain, shortness of breath, or abdominal pain.  Past Medical History:  Diagnosis Date   Hemophilia [facility])    patient states she does not have.  Seen by provider and denied diagnosis     Patient Active Problem List   Diagnosis Date Noted   Normal labor 11/27/2018   Supervision of high risk pregnancy, antepartum 07/16/2018   Anemia affecting pregnancy, antepartum 07/16/2018    Past Surgical History:  Procedure Laterality Date   TONSILLECTOMY AND ADENOIDECTOMY      OB History     Gravida  1   Para  1   Term  1   Preterm  0   AB  0   Living  1      SAB  0   IAB  0   Ectopic  0   Multiple  0   Live Births  1            Home Medications    Prior to Admission medications   Medication Sig Start Date End Date Taking? Authorizing Provider   amoxicillin-clavulanate (AUGMENTIN) 875-125 MG tablet Take 1 tablet by mouth 2 (two) times daily for 7 days. 05/02/21 05/09/21 Yes Madelyn Brunner, DO    Family History Family History  Problem Relation Age of Onset   Diabetes Mother    Diabetes Maternal Grandmother    Hearing loss Maternal Grandfather     Social History Social History   Tobacco Use   Smoking status: Former    Types: Cigars   Smokeless tobacco: Never  Vaping Use   Vaping Use: Never used  Substance Use Topics   Alcohol use: Not Currently   Drug use: Not Currently    Types: Marijuana    Comment: last used 05-11-18     Allergies   Patient has no known allergies.   Review of Systems Review of Systems  Constitutional:  Positive for fatigue.  HENT:  Positive for mouth sores and sore throat. Negative for congestion.   Respiratory:  Negative for cough and shortness of breath.   Cardiovascular:  Negative for chest pain.  Gastrointestinal:  Negative for abdominal pain.  Musculoskeletal:  Negative for neck pain.  Skin:  Negative for rash.  Neurological:  Negative for headaches.    Physical Exam Triage Vital Signs ED Triage Vitals  Enc Vitals Group     BP 05/02/21 1309 135/78     Pulse Rate 05/02/21 1309 74     Resp 05/02/21 1309 18     Temp 05/02/21 1309 98.4 F (36.9 C)     Temp Source 05/02/21 1309 Oral     SpO2 05/02/21 1309 97 %     Weight 05/02/21 1310 264 lb 12.4 oz (120.1 kg)     Height 05/02/21 1310 5\' 7"  (1.702 m)     Head Circumference --      Peak Flow --      Pain Score 05/02/21 1309 8     Pain Loc --      Pain Edu? --      Excl. in GC? --    No data found.  Updated Vital Signs BP 135/78 (BP Location: Left Arm)    Pulse 74    Temp 98.4 F (36.9 C) (Oral)    Resp 18    Ht 5\' 7"  (1.702 m)    Wt 120.1 kg    SpO2 97%    BMI 41.47 kg/m   Physical Exam Constitutional:      General: She is not in acute distress.    Appearance: She is not ill-appearing.  HENT:     Head: Normocephalic  and atraumatic.     Nose: No congestion.     Mouth/Throat:     Mouth: Mucous membranes are moist.     Pharynx: Oropharyngeal exudate and posterior oropharyngeal erythema present.     Comments: + erythematous posteriororopharnynx + upper right molar with some redness surrounding tooth, no abscess palpated, soft floor of mouth under tongue + aphthous ulcers noted on upper palate Eyes:     Conjunctiva/sclera: Conjunctivae normal.     Pupils: Pupils are equal, round, and reactive to light.  Cardiovascular:     Rate and Rhythm: Normal rate.  Pulmonary:     Effort: Pulmonary effort is normal. No respiratory distress.  Abdominal:     General: Abdomen is flat. There is no distension.     Palpations: Abdomen is soft.     Tenderness: There is no abdominal tenderness.  Musculoskeletal:        General: Normal range of motion.     Cervical back: Normal range of motion.  Lymphadenopathy:     Cervical: Cervical adenopathy present.  Skin:    General: Skin is warm.     Capillary Refill: Capillary refill takes less than 2 seconds.  Neurological:     Mental Status: She is alert.  Psychiatric:        Mood and Affect: Mood normal.        Thought Content: Thought content normal.     UC Treatments / Results  Labs (all labs ordered are listed, but only abnormal results are displayed) Labs Reviewed - No data to display  EKG   Radiology No results found.  Procedures Procedures (including critical care time)  Medications Ordered in UC Medications - No data to display  Initial Impression / Assessment and Plan / UC Course  I have reviewed the triage vital signs and the nursing notes.  Pertinent labs & imaging results that were available during my care of the patient were reviewed by me and considered in my medical decision making (see chart for details).     Patient presents with pharyngitis in the  setting of early dental infection of her right upper molar.  Has seen dentist and has  planned on getting this extracted in March.  She does have exudates of her posterior oropharynx and some aphthous ulcers on the upper palate.  Given the pharyngitis and the early infection surrounding the tooth, we will treat with Augmentin twice daily x7 days.  There is no abscess noted on examination and the roof of the mouth is soft so no concern for Ludwick's or dental abscess.  Did discuss importance of warm salt water gargles multiple times a day for the aphthous ulcers.  If her throat and tooth is not improving after the completion of antibiotic therapy, would recommend follow-up here or with her primary care.  Continue follow-up appointment with dentistry as this will be the overall eventual treatment for the tooth.  Strict return precautions were provided.  Patient is safe for discharge home. Final Clinical Impressions(s) / UC Diagnoses   Final diagnoses:  Acute pharyngitis, unspecified etiology  Pain, dental  Dental infection  Aphthous ulcer     Discharge Instructions      Rinse the mouth with warm salt water 3x per day  Take Augmentin 1 tablet twice a day x 7 days   If your mouth is not feeling better after completion of the antibiotic, would recommend follow-up here in the urgent care  Make sure you are following up with your dentist       ED Prescriptions     Medication Sig Dispense Auth. Provider   amoxicillin-clavulanate (AUGMENTIN) 875-125 MG tablet Take 1 tablet by mouth 2 (two) times daily for 7 days. 14 tablet Madelyn Brunner, DO      PDMP not reviewed this encounter.   Madelyn Brunner, DO 05/02/21 1347

## 2021-05-02 NOTE — Discharge Instructions (Addendum)
Rinse the mouth with warm salt water 3x per day  Take Augmentin 1 tablet twice a day x 7 days   If your mouth is not feeling better after completion of the antibiotic, would recommend follow-up here in the urgent care  Make sure you are following up with your dentist

## 2021-05-11 ENCOUNTER — Ambulatory Visit: Payer: Medicaid Other | Admitting: Certified Nurse Midwife

## 2021-12-02 ENCOUNTER — Encounter (HOSPITAL_COMMUNITY): Payer: Self-pay | Admitting: Emergency Medicine

## 2021-12-02 ENCOUNTER — Ambulatory Visit (HOSPITAL_COMMUNITY)
Admission: EM | Admit: 2021-12-02 | Discharge: 2021-12-02 | Disposition: A | Discharge status: Home / Self Care | Payer: Medicaid Other | Attending: Physician Assistant | Admitting: Physician Assistant

## 2021-12-02 DIAGNOSIS — N946 Dysmenorrhea, unspecified: Secondary | ICD-10-CM

## 2021-12-02 LAB — POC URINE PREG, ED: Preg Test, Ur: NEGATIVE

## 2021-12-02 MED ORDER — KETOROLAC TROMETHAMINE 30 MG/ML IJ SOLN
INTRAMUSCULAR | Status: AC
  Filled 2021-12-02: qty 1

## 2021-12-02 MED ORDER — KETOROLAC TROMETHAMINE 30 MG/ML IJ SOLN
30.0000 mg | Freq: Once | INTRAMUSCULAR | Status: AC
  Administered 2021-12-02: 30 mg via INTRAMUSCULAR

## 2021-12-02 NOTE — ED Triage Notes (Signed)
Pt reports that her menstrual cycle started today. C/o bad cramping and heavy bleeding with clots. Reports that her period last month was at the beginning of the month so this one was later than scheduled. Wanting something for pains, denies taking anything today for pains.

## 2021-12-02 NOTE — Discharge Instructions (Signed)
Follow up with OBGYN to discuss periods and birth control options Recommend Ibuprofen or Tylenol as needed for pain Return if symptoms become worse

## 2021-12-04 NOTE — ED Provider Notes (Signed)
[place]    CSN: [id number] Arrival date & time: 12/02/21  1935      History   Chief Complaint Chief Complaint  Patient presents with   Dysmenorrhea    HPI Tammy Lynch is a 21 y.o. female.   Pt complains of painful period cramps that started earlier today.  She reports her period was one week late and she is having heavier bleeding than normal.  Currently only using barrier method for contraception. She has taken no medications today.  She is not followed by OBGYN.    Past Medical History:  Diagnosis Date   Hemophilia [facility])    patient states she does not have.  Seen by provider and denied diagnosis     Patient Active Problem List   Diagnosis Date Noted   Normal labor 11/27/2018   Supervision of high risk pregnancy, antepartum 07/16/2018   Anemia affecting pregnancy, antepartum 07/16/2018    Past Surgical History:  Procedure Laterality Date   TONSILLECTOMY AND ADENOIDECTOMY      OB History     Gravida  1   Para  1   Term  1   Preterm  0   AB  0   Living  1      SAB  0   IAB  0   Ectopic  0   Multiple  0   Live Births  1            Home Medications    Prior to Admission medications   Not on File    Family History Family History  Problem Relation Age of Onset   Diabetes Mother    Diabetes Maternal Grandmother    Hearing loss Maternal Grandfather     Social History Social History   Tobacco Use   Smoking status: Former    Types: Cigars   Smokeless tobacco: Never  Vaping Use   Vaping Use: Never used  Substance Use Topics   Alcohol use: Not Currently   Drug use: Not Currently    Types: Marijuana    Comment: last used 05-11-18     Allergies   Patient has no known allergies.   Review of Systems Review of Systems  Constitutional:  Negative for chills and fever.  HENT:  Negative for ear pain and sore throat.   Eyes:  Negative for pain and visual disturbance.  Respiratory:  Negative for cough and  shortness of breath.   Cardiovascular:  Negative for chest pain and palpitations.  Gastrointestinal:  Negative for abdominal pain and vomiting.  Genitourinary:  Positive for menstrual problem and vaginal bleeding. Negative for dysuria and hematuria.  Musculoskeletal:  Negative for arthralgias and back pain.  Skin:  Negative for color change and rash.  Neurological:  Negative for seizures and syncope.  All other systems reviewed and are negative.    Physical Exam Triage Vital Signs ED Triage Vitals [12/02/21 1944]  Enc Vitals Group     BP 114/63     Pulse Rate 92     Resp 18     Temp 98.9 F (37.2 C)     Temp Source Oral     SpO2 100 %     Weight      Height      Head Circumference      Peak Flow      Pain Score 6     Pain Loc      Pain Edu?      Excl. in [place]?  No data found.  Updated Vital Signs BP 114/63 (BP Location: Left Arm)   Pulse 92   Temp 98.9 F (37.2 C) (Oral)   Resp 18   LMP 12/02/2021   SpO2 100%   Visual Acuity Right Eye Distance:   Left Eye Distance:   Bilateral Distance:    Right Eye Near:   Left Eye Near:    Bilateral Near:     Physical Exam Vitals and nursing note reviewed.  Constitutional:      General: She is not in acute distress.    Appearance: She is well-developed.  HENT:     Head: Normocephalic and atraumatic.  Eyes:     Conjunctiva/sclera: Conjunctivae normal.  Cardiovascular:     Rate and Rhythm: Normal rate and regular rhythm.     Heart sounds: No murmur heard. Pulmonary:     Effort: Pulmonary effort is normal. No respiratory distress.     Breath sounds: Normal breath sounds.  Abdominal:     Palpations: Abdomen is soft.     Tenderness: There is no abdominal tenderness.  Musculoskeletal:        General: No swelling.     Cervical back: Neck supple.  Skin:    General: Skin is warm and dry.     Capillary Refill: Capillary refill takes less than 2 seconds.  Neurological:     Mental Status: She is alert.  Psychiatric:         Mood and Affect: Mood normal.      UC Treatments / Results  Labs (all labs ordered are listed, but only abnormal results are displayed) Labs Reviewed  POC URINE PREG, ED    EKG   Radiology No results found.  Procedures Procedures (including critical care time)  Medications Ordered in UC Medications  ketorolac (TORADOL) 30 MG/ML injection 30 mg (30 mg Intramuscular Given 12/02/21 2009)    Initial Impression / Assessment and Plan / UC Course  I have reviewed the triage vital signs and the nursing notes.  Pertinent labs & imaging results that were available during my care of the patient were reviewed by me and considered in my medical decision making (see chart for details).     Dysmenorrhea, supportive care discussed.  Urine pregnancy negative.  Advised to follow up with OBGYN.  Return precautions discussed.  Vitals stable, overall well appearing, no acute distress.  Final Clinical Impressions(s) / UC Diagnoses   Final diagnoses:  Dysmenorrhea     Discharge Instructions      Follow up with OBGYN to discuss periods and birth control options Recommend Ibuprofen or Tylenol as needed for pain Return if symptoms become worse    ED Prescriptions   None    PDMP not reviewed this encounter.   Ward, Lenise Arena, PA-C 12/04/21 1200
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
## 2022-05-18 ENCOUNTER — Emergency Department (HOSPITAL_COMMUNITY)
Admission: EM | Admit: 2022-05-18 | Discharge: 2022-05-18 | Discharge status: Against Medical Advice | Payer: Medicaid Other | Attending: Emergency Medicine | Admitting: Emergency Medicine

## 2022-05-18 DIAGNOSIS — S0990XA Unspecified injury of head, initial encounter: Secondary | ICD-10-CM | POA: Diagnosis present

## 2022-05-18 DIAGNOSIS — S0083XA Contusion of other part of head, initial encounter: Secondary | ICD-10-CM | POA: Insufficient documentation
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
